# Patient Record
Sex: Male | Born: 1965 | Race: White | Hispanic: No | Smoking: Current every day smoker
Health system: Southern US, Community
[De-identification: ages and names within clinical notes are randomized; demographics above are authoritative.]

## PROBLEM LIST (undated history)

## (undated) DIAGNOSIS — F99 Mental disorder, not otherwise specified: Secondary | ICD-10-CM

## (undated) DIAGNOSIS — I2510 Atherosclerotic heart disease of native coronary artery without angina pectoris: Secondary | ICD-10-CM

## (undated) DIAGNOSIS — I209 Angina pectoris, unspecified: Secondary | ICD-10-CM

## (undated) DIAGNOSIS — I10 Essential (primary) hypertension: Secondary | ICD-10-CM

## (undated) DIAGNOSIS — I219 Acute myocardial infarction, unspecified: Secondary | ICD-10-CM

## (undated) DIAGNOSIS — I639 Cerebral infarction, unspecified: Secondary | ICD-10-CM

## (undated) DIAGNOSIS — R0602 Shortness of breath: Secondary | ICD-10-CM

## (undated) DIAGNOSIS — E119 Type 2 diabetes mellitus without complications: Secondary | ICD-10-CM

## (undated) DIAGNOSIS — M1990 Unspecified osteoarthritis, unspecified site: Secondary | ICD-10-CM

## (undated) DIAGNOSIS — E785 Hyperlipidemia, unspecified: Secondary | ICD-10-CM

## (undated) DIAGNOSIS — G709 Myoneural disorder, unspecified: Secondary | ICD-10-CM

## (undated) DIAGNOSIS — N289 Disorder of kidney and ureter, unspecified: Secondary | ICD-10-CM

## (undated) HISTORY — PX: BYPASS GRAFT: SHX909

## (undated) HISTORY — PX: CHOLECYSTECTOMY: SHX55

## (undated) HISTORY — PX: CORONARY ARTERY BYPASS GRAFT: SHX141

---

## 2013-10-17 ENCOUNTER — Encounter (HOSPITAL_COMMUNITY): Payer: Self-pay | Admitting: Emergency Medicine

## 2013-10-17 ENCOUNTER — Inpatient Hospital Stay (HOSPITAL_COMMUNITY): Payer: Medicaid Other

## 2013-10-17 ENCOUNTER — Emergency Department (HOSPITAL_COMMUNITY): Payer: Medicaid Other

## 2013-10-17 ENCOUNTER — Observation Stay (HOSPITAL_COMMUNITY)
Admission: EM | Admit: 2013-10-17 | Discharge: 2013-10-19 | Discharge status: Against Medical Advice | Payer: Medicaid Other | Attending: Family Medicine | Admitting: Family Medicine

## 2013-10-17 DIAGNOSIS — F17200 Nicotine dependence, unspecified, uncomplicated: Secondary | ICD-10-CM | POA: Insufficient documentation

## 2013-10-17 DIAGNOSIS — I672 Cerebral atherosclerosis: Secondary | ICD-10-CM | POA: Insufficient documentation

## 2013-10-17 DIAGNOSIS — Z7901 Long term (current) use of anticoagulants: Secondary | ICD-10-CM | POA: Insufficient documentation

## 2013-10-17 DIAGNOSIS — F329 Major depressive disorder, single episode, unspecified: Secondary | ICD-10-CM | POA: Insufficient documentation

## 2013-10-17 DIAGNOSIS — I129 Hypertensive chronic kidney disease with stage 1 through stage 4 chronic kidney disease, or unspecified chronic kidney disease: Secondary | ICD-10-CM | POA: Insufficient documentation

## 2013-10-17 DIAGNOSIS — Z7982 Long term (current) use of aspirin: Secondary | ICD-10-CM | POA: Insufficient documentation

## 2013-10-17 DIAGNOSIS — R739 Hyperglycemia, unspecified: Secondary | ICD-10-CM

## 2013-10-17 DIAGNOSIS — I10 Essential (primary) hypertension: Secondary | ICD-10-CM

## 2013-10-17 DIAGNOSIS — N183 Chronic kidney disease, stage 3 unspecified: Secondary | ICD-10-CM | POA: Insufficient documentation

## 2013-10-17 DIAGNOSIS — G459 Transient cerebral ischemic attack, unspecified: Secondary | ICD-10-CM

## 2013-10-17 DIAGNOSIS — D649 Anemia, unspecified: Secondary | ICD-10-CM | POA: Insufficient documentation

## 2013-10-17 DIAGNOSIS — N189 Chronic kidney disease, unspecified: Secondary | ICD-10-CM

## 2013-10-17 DIAGNOSIS — E119 Type 2 diabetes mellitus without complications: Secondary | ICD-10-CM | POA: Insufficient documentation

## 2013-10-17 DIAGNOSIS — Z8673 Personal history of transient ischemic attack (TIA), and cerebral infarction without residual deficits: Secondary | ICD-10-CM | POA: Insufficient documentation

## 2013-10-17 DIAGNOSIS — N289 Disorder of kidney and ureter, unspecified: Secondary | ICD-10-CM

## 2013-10-17 DIAGNOSIS — R079 Chest pain, unspecified: Principal | ICD-10-CM | POA: Insufficient documentation

## 2013-10-17 DIAGNOSIS — E785 Hyperlipidemia, unspecified: Secondary | ICD-10-CM | POA: Insufficient documentation

## 2013-10-17 DIAGNOSIS — I2510 Atherosclerotic heart disease of native coronary artery without angina pectoris: Secondary | ICD-10-CM | POA: Insufficient documentation

## 2013-10-17 DIAGNOSIS — F3289 Other specified depressive episodes: Secondary | ICD-10-CM | POA: Insufficient documentation

## 2013-10-17 DIAGNOSIS — F0150 Vascular dementia without behavioral disturbance: Secondary | ICD-10-CM | POA: Insufficient documentation

## 2013-10-17 DIAGNOSIS — I369 Nonrheumatic tricuspid valve disorder, unspecified: Secondary | ICD-10-CM

## 2013-10-17 DIAGNOSIS — Z951 Presence of aortocoronary bypass graft: Secondary | ICD-10-CM | POA: Insufficient documentation

## 2013-10-17 DIAGNOSIS — Z7902 Long term (current) use of antithrombotics/antiplatelets: Secondary | ICD-10-CM | POA: Insufficient documentation

## 2013-10-17 HISTORY — DX: Type 2 diabetes mellitus without complications: E11.9

## 2013-10-17 HISTORY — DX: Angina pectoris, unspecified: I20.9

## 2013-10-17 HISTORY — DX: Cerebral infarction, unspecified: I63.9

## 2013-10-17 HISTORY — DX: Hyperlipidemia, unspecified: E78.5

## 2013-10-17 HISTORY — DX: Shortness of breath: R06.02

## 2013-10-17 HISTORY — DX: Disorder of kidney and ureter, unspecified: N28.9

## 2013-10-17 HISTORY — DX: Essential (primary) hypertension: I10

## 2013-10-17 HISTORY — DX: Atherosclerotic heart disease of native coronary artery without angina pectoris: I25.10

## 2013-10-17 HISTORY — DX: Myoneural disorder, unspecified: G70.9

## 2013-10-17 HISTORY — DX: Acute myocardial infarction, unspecified: I21.9

## 2013-10-17 HISTORY — DX: Mental disorder, not otherwise specified: F99

## 2013-10-17 HISTORY — DX: Unspecified osteoarthritis, unspecified site: M19.90

## 2013-10-17 LAB — CBC
HCT: 33.1 % — ABNORMAL LOW (ref 39.0–52.0)
HEMOGLOBIN: 11.2 g/dL — AB (ref 13.0–17.0)
MCH: 29.5 pg (ref 26.0–34.0)
MCHC: 33.8 g/dL (ref 30.0–36.0)
MCV: 87.1 fL (ref 78.0–100.0)
Platelets: 336 10*3/uL (ref 150–400)
RBC: 3.8 MIL/uL — AB (ref 4.22–5.81)
RDW: 13.7 % (ref 11.5–15.5)
WBC: 9.6 10*3/uL (ref 4.0–10.5)

## 2013-10-17 LAB — D-DIMER, QUANTITATIVE (NOT AT ARMC): D DIMER QUANT: 0.51 ug{FEU}/mL — AB (ref 0.00–0.48)

## 2013-10-17 LAB — GLUCOSE, RANDOM: GLUCOSE: 646 mg/dL — AB (ref 70–99)

## 2013-10-17 LAB — GLUCOSE, CAPILLARY
Glucose-Capillary: 564 mg/dL (ref 70–99)
Glucose-Capillary: 135 mg/dL — ABNORMAL HIGH (ref 70–99)
Glucose-Capillary: 42 mg/dL — CL (ref 70–99)
Glucose-Capillary: 42 mg/dL — CL (ref 70–99)

## 2013-10-17 LAB — BASIC METABOLIC PANEL
BUN: 36 mg/dL — AB (ref 6–23)
CO2: 23 meq/L (ref 19–32)
Calcium: 9 mg/dL (ref 8.4–10.5)
Chloride: 97 mEq/L (ref 96–112)
Creatinine, Ser: 2.18 mg/dL — ABNORMAL HIGH (ref 0.50–1.35)
GFR calc Af Amer: 40 mL/min — ABNORMAL LOW (ref >90)
GFR, EST NON AFRICAN AMERICAN: 35 mL/min — AB (ref >90)
Glucose, Bld: 253 mg/dL — ABNORMAL HIGH (ref 70–99)
Potassium: 4.8 mEq/L (ref 3.7–5.3)
Sodium: 136 mEq/L — ABNORMAL LOW (ref 137–147)

## 2013-10-17 LAB — HEMOGLOBIN A1C
Hgb A1c MFr Bld: 9.3 % — ABNORMAL HIGH (ref <5.7)
MEAN PLASMA GLUCOSE: 220 mg/dL — AB (ref <117)

## 2013-10-17 LAB — TROPONIN I: Troponin I: <0.3 ng/mL (ref <0.30)

## 2013-10-17 LAB — POCT I-STAT TROPONIN I: Troponin i, poc: 0.01 ng/mL (ref 0.00–0.08)

## 2013-10-17 MED ORDER — GLUCOSE 40 % PO GEL
ORAL | Status: AC
Start: 2013-10-17 — End: 2013-10-17
  Administered 2013-10-17: 37.5 g
  Filled 2013-10-17: qty 1

## 2013-10-17 MED ORDER — FLUOXETINE HCL 20 MG PO CAPS
20.0000 mg | ORAL_CAPSULE | Freq: Every day | ORAL | Status: DC
  Administered 2013-10-17 – 2013-10-19 (×3): 20 mg via ORAL
  Filled 2013-10-17 (×3): qty 1

## 2013-10-17 MED ORDER — TOPIRAMATE 25 MG PO TABS
50.0000 mg | ORAL_TABLET | Freq: Two times a day (BID) | ORAL | Status: DC
  Administered 2013-10-17 – 2013-10-19 (×5): 50 mg via ORAL
  Filled 2013-10-17 (×7): qty 2

## 2013-10-17 MED ORDER — TECHNETIUM TO 99M ALBUMIN AGGREGATED
6.0000 | Freq: Once | INTRAVENOUS | Status: AC | PRN
  Administered 2013-10-17: 6 via INTRAVENOUS

## 2013-10-17 MED ORDER — INSULIN DETEMIR 100 UNIT/ML ~~LOC~~ SOLN
30.0000 [IU] | Freq: Every day | SUBCUTANEOUS | Status: DC
  Filled 2013-10-17 (×3): qty 0.3

## 2013-10-17 MED ORDER — SODIUM CHLORIDE 0.9 % IJ SOLN
3.0000 mL | Freq: Two times a day (BID) | INTRAMUSCULAR | Status: DC
  Administered 2013-10-17 – 2013-10-19 (×5): 3 mL via INTRAVENOUS

## 2013-10-17 MED ORDER — ASPIRIN 81 MG PO CHEW
324.0000 mg | CHEWABLE_TABLET | Freq: Once | ORAL | Status: AC
  Administered 2013-10-17: 324 mg via ORAL
  Filled 2013-10-17: qty 4

## 2013-10-17 MED ORDER — INSULIN ASPART 100 UNIT/ML ~~LOC~~ SOLN: 0.0000 [IU] | Freq: Three times a day (TID) | SUBCUTANEOUS | Status: DC

## 2013-10-17 MED ORDER — GABAPENTIN 600 MG PO TABS
300.0000 mg | ORAL_TABLET | Freq: Three times a day (TID) | ORAL | Status: DC
  Filled 2013-10-17 (×3): qty 0.5

## 2013-10-17 MED ORDER — SODIUM CHLORIDE 0.9 % IV BOLUS (SEPSIS)
1000.0000 mL | Freq: Once | INTRAVENOUS | Status: AC
  Administered 2013-10-17: 1000 mL via INTRAVENOUS

## 2013-10-17 MED ORDER — CLOPIDOGREL BISULFATE 75 MG PO TABS
75.0000 mg | ORAL_TABLET | Freq: Every day | ORAL | Status: DC
  Administered 2013-10-18 – 2013-10-19 (×2): 75 mg via ORAL
  Filled 2013-10-17 (×3): qty 1

## 2013-10-17 MED ORDER — ASPIRIN EC 325 MG PO TBEC
325.0000 mg | DELAYED_RELEASE_TABLET | Freq: Every day | ORAL | Status: DC
  Administered 2013-10-17 – 2013-10-19 (×3): 325 mg via ORAL
  Filled 2013-10-17 (×3): qty 1

## 2013-10-17 MED ORDER — CARVEDILOL 6.25 MG PO TABS
6.2500 mg | ORAL_TABLET | Freq: Two times a day (BID) | ORAL | Status: DC
  Administered 2013-10-17 – 2013-10-19 (×4): 6.25 mg via ORAL
  Filled 2013-10-17 (×6): qty 1

## 2013-10-17 MED ORDER — TECHNETIUM TC 99M DIETHYLENETRIAME-PENTAACETIC ACID: 40.0000 | Freq: Once | INTRAVENOUS | Status: AC | PRN

## 2013-10-17 MED ORDER — ENOXAPARIN SODIUM 80 MG/0.8ML ~~LOC~~ SOLN
1.0000 mg/kg | Freq: Two times a day (BID) | SUBCUTANEOUS | Status: DC
  Administered 2013-10-17: 13:00:00 via SUBCUTANEOUS
  Administered 2013-10-18: 75 mg via SUBCUTANEOUS
  Filled 2013-10-17 (×5): qty 0.8

## 2013-10-17 MED ORDER — MORPHINE SULFATE 2 MG/ML IJ SOLN
2.0000 mg | INTRAMUSCULAR | Status: DC | PRN
  Administered 2013-10-17: 2 mg via INTRAVENOUS
  Filled 2013-10-17: qty 1

## 2013-10-17 MED ORDER — NITROGLYCERIN 0.4 MG SL SUBL
0.4000 mg | SUBLINGUAL_TABLET | SUBLINGUAL | Status: AC | PRN
  Administered 2013-10-17 (×3): 0.4 mg via SUBLINGUAL

## 2013-10-17 MED ORDER — MORPHINE SULFATE 4 MG/ML IJ SOLN
4.0000 mg | Freq: Once | INTRAMUSCULAR | Status: AC
  Administered 2013-10-17: 4 mg via INTRAVENOUS
  Filled 2013-10-17: qty 1

## 2013-10-17 MED ORDER — ATORVASTATIN CALCIUM 40 MG PO TABS
40.0000 mg | ORAL_TABLET | Freq: Every day | ORAL | Status: DC
  Administered 2013-10-17 – 2013-10-18 (×2): 40 mg via ORAL
  Filled 2013-10-17 (×3): qty 1

## 2013-10-17 MED ORDER — INSULIN DETEMIR 100 UNIT/ML ~~LOC~~ SOLN
30.0000 [IU] | Freq: Every day | SUBCUTANEOUS | Status: DC
  Filled 2013-10-17: qty 0.3

## 2013-10-17 MED ORDER — INSULIN ASPART 100 UNIT/ML ~~LOC~~ SOLN
0.0000 [IU] | Freq: Three times a day (TID) | SUBCUTANEOUS | Status: DC
Start: 2013-10-17 — End: 2013-10-19
  Administered 2013-10-17: 3 [IU] via SUBCUTANEOUS
  Administered 2013-10-17 – 2013-10-18 (×2): 20 [IU] via SUBCUTANEOUS
  Administered 2013-10-18: 15 [IU] via SUBCUTANEOUS

## 2013-10-17 MED ORDER — INSULIN DETEMIR 100 UNIT/ML ~~LOC~~ SOLN
30.0000 [IU] | Freq: Every day | SUBCUTANEOUS | Status: DC
  Filled 2013-10-17 (×2): qty 0.3

## 2013-10-17 MED ORDER — LAMOTRIGINE 25 MG PO TABS
25.0000 mg | ORAL_TABLET | Freq: Two times a day (BID) | ORAL | Status: DC
  Administered 2013-10-17 – 2013-10-19 (×5): 25 mg via ORAL
  Filled 2013-10-17 (×6): qty 1

## 2013-10-17 MED ORDER — GABAPENTIN 300 MG PO CAPS
300.0000 mg | ORAL_CAPSULE | Freq: Three times a day (TID) | ORAL | Status: DC
  Administered 2013-10-17 – 2013-10-19 (×6): 300 mg via ORAL
  Filled 2013-10-17 (×9): qty 1

## 2013-10-17 MED ORDER — SODIUM CHLORIDE 0.9 % IJ SOLN: 3.0000 mL | INTRAMUSCULAR | Status: DC | PRN

## 2013-10-17 MED ORDER — ALPRAZOLAM 0.25 MG PO TABS
0.5000 mg | ORAL_TABLET | Freq: Two times a day (BID) | ORAL | Status: DC
  Administered 2013-10-17 – 2013-10-19 (×4): 0.5 mg via ORAL
  Filled 2013-10-17 (×5): qty 2

## 2013-10-17 MED ORDER — INSULIN DETEMIR 100 UNIT/ML ~~LOC~~ SOLN
30.0000 [IU] | Freq: Once | SUBCUTANEOUS | Status: AC
  Administered 2013-10-17: 30 [IU] via SUBCUTANEOUS
  Filled 2013-10-17: qty 0.3

## 2013-10-17 MED ORDER — ONDANSETRON HCL 4 MG/2ML IJ SOLN
4.0000 mg | Freq: Once | INTRAMUSCULAR | Status: AC
Start: 2013-10-17 — End: 2013-10-17
  Administered 2013-10-17: 4 mg via INTRAVENOUS
  Filled 2013-10-17: qty 2

## 2013-10-17 MED ORDER — MEMANTINE HCL 10 MG PO TABS
10.0000 mg | ORAL_TABLET | Freq: Two times a day (BID) | ORAL | Status: DC
  Administered 2013-10-17 – 2013-10-19 (×5): 10 mg via ORAL
  Filled 2013-10-17 (×6): qty 1

## 2013-10-17 MED ORDER — INSULIN ASPART 100 UNIT/ML ~~LOC~~ SOLN
10.0000 [IU] | Freq: Three times a day (TID) | SUBCUTANEOUS | Status: DC
Start: 2013-10-17 — End: 2013-10-19
  Administered 2013-10-17 – 2013-10-18 (×3): 10 [IU] via SUBCUTANEOUS

## 2013-10-17 MED ORDER — MORPHINE SULFATE 4 MG/ML IJ SOLN
4.0000 mg | Freq: Once | INTRAMUSCULAR | Status: AC
Start: 2013-10-17 — End: 2013-10-17
  Administered 2013-10-17: 4 mg via INTRAVENOUS
  Filled 2013-10-17: qty 1

## 2013-10-17 MED ORDER — EZETIMIBE 10 MG PO TABS
10.0000 mg | ORAL_TABLET | Freq: Every day | ORAL | Status: DC
  Administered 2013-10-17 – 2013-10-19 (×3): 10 mg via ORAL
  Filled 2013-10-17 (×3): qty 1

## 2013-10-17 MED ORDER — NITROGLYCERIN 2 % TD OINT
0.5000 [in_us] | TOPICAL_OINTMENT | Freq: Four times a day (QID) | TRANSDERMAL | Status: DC
  Administered 2013-10-17 – 2013-10-19 (×6): 0.5 [in_us] via TOPICAL
  Filled 2013-10-17: qty 30

## 2013-10-17 MED ORDER — SODIUM CHLORIDE 0.9 % IV SOLN: 250.0000 mL | INTRAVENOUS | Status: DC | PRN

## 2013-10-17 MED ORDER — NITROGLYCERIN 2 % TD OINT
0.5000 [in_us] | TOPICAL_OINTMENT | Freq: Four times a day (QID) | TRANSDERMAL | Status: DC
  Administered 2013-10-17: 0.5 [in_us] via TOPICAL
  Filled 2013-10-17: qty 1

## 2013-10-17 NOTE — H&P (Signed)
PCP:   No PCP Per Patient   Chief Complaint:  Chest pain  HPI: 48 year old male who   has a past medical history of Diabetes mellitus without complication; Hypertension; and Hyperlipidemia, multiple TIAs, CAD status post CABG, x4 vessel bypass in 2005, vascular dementia, who came to the hospital last night after patient experienced chest pain while waiting at the bus station. Patient has been traveling from [place] to [place] to meet his new girlfriend, patient says that chest pain was associated with shortness of breath and nausea, pain was persistent and was relieved when he came to the ED report morphine. Patient was recently hospitalized in [place] for influenza. Patient has not seen the cardiologist in past 2 years. He does smoke cigarettes. Patient says that he has been taking his medications as prescribed. At this time patient does have right-sided chest pain under his right ribs, which becomes worse on deep breathing. In the ED patient was found to have elevated d-dimer of 0.51, troponin is negative. EKG shows nonspecific T wave abnormalities.   Allergies:   Allergies  Allergen Reactions  . Dilantin [Phenytoin Sodium Extended] Itching  . Levaquin [Levofloxacin In D5w] Itching  . Seroquel [Quetiapine Fumarate] Itching      Past Medical History  Diagnosis Date  . Diabetes mellitus without complication   . Hypertension   . Hyperlipidemia     Past Surgical History  Procedure Laterality Date  . Bypass graft      quadruple    Prior to Admission medications   Not on File    Social History:  reports that he has been smoking.  He has never used smokeless tobacco. He reports that he does not drink alcohol or use illicit drugs.  Family history- patient's mother and father both had coronary artery disease   All the positives are listed in BOLD  Review of Systems:  HEENT: Headache, blurred vision, runny nose, sore throat Neck: Hypothyroidism,  hyperthyroidism,,lymphadenopathy Chest : Shortness of breath, history of COPD, Asthma Heart : Chest pain, history of coronary arterey disease GI:  Nausea, vomiting, diarrhea, constipation, GERD GU: Dysuria, urgency, frequency of urination, hematuria Neuro: Stroke, seizures, syncope Psych: Depression, anxiety, hallucinations   Physical Exam: Blood pressure 121/68, pulse 81, temperature 98 F (36.7 C), temperature source Oral, resp. rate 12, height $RemoveBe'5\' 8"'HQOPFmotg$  (1.727 m), weight 76.658 kg (169 lb), SpO2 96.00%. Constitutional:   Patient is a well-developed and well-nourished male* in no acute distress and cooperative with exam. Head: Normocephalic and atraumatic Mouth: Mucus membranes moist Eyes: PERRL, EOMI, conjunctivae normal Neck: Supple, No Thyromegaly Cardiovascular: RRR, S1 normal, S2 normal Pulmonary/Chest: CTAB, no wheezes, rales, or rhonchi Chest wall- positive tenderness to palpation under the right ribs Abdominal: Soft. Non-tender, non-distended, bowel sounds are normal, no masses, organomegaly, or guarding present.  Neurological: A&O x3, Strenght is normal and symmetric bilaterally, cranial nerve II-XII are grossly intact, no focal motor deficit, sensory intact to light touch bilaterally.  Extremities : No Cyanosis, Clubbing or Edema   Labs on Admission:  Results for orders placed during the hospital encounter of 10/17/13 (from the past 48 hour(s))  POCT I-STAT TROPONIN I     Status: None   Collection Time    10/17/13  1:52 AM      Result Value Range   Troponin i, poc 0.01  0.00 - 0.08 ng/mL   Comment 3            Comment: Due to the release kinetics of cTnI,  a negative result within the first hours     of the onset of symptoms does not rule out     myocardial infarction with certainty.     If myocardial infarction is still suspected,     repeat the test at appropriate intervals.  CBC     Status: Abnormal   Collection Time    10/17/13  1:56 AM      Result Value Range    WBC 9.6  4.0 - 10.5 K/uL   RBC 3.80 (*) 4.22 - 5.81 MIL/uL   Hemoglobin 11.2 (*) 13.0 - 17.0 g/dL   HCT 33.1 (*) 39.0 - 52.0 %   MCV 87.1  78.0 - 100.0 fL   MCH 29.5  26.0 - 34.0 pg   MCHC 33.8  30.0 - 36.0 g/dL   RDW 13.7  11.5 - 15.5 %   Platelets 336  150 - 400 K/uL  BASIC METABOLIC PANEL     Status: Abnormal   Collection Time    10/17/13  1:56 AM      Result Value Range   Sodium 136 (*) 137 - 147 mEq/L   Potassium 4.8  3.7 - 5.3 mEq/L   Chloride 97  96 - 112 mEq/L   CO2 23  19 - 32 mEq/L   Glucose, Bld 253 (*) 70 - 99 mg/dL   BUN 36 (*) 6 - 23 mg/dL   Creatinine, Ser 2.18 (*) 0.50 - 1.35 mg/dL   Calcium 9.0  8.4 - 10.5 mg/dL   GFR calc non Af Amer 35 (*) >90 mL/min   GFR calc Af Amer 40 (*) >90 mL/min   Comment: (NOTE)     The eGFR has been calculated using the CKD EPI equation.     This calculation has not been validated in all clinical situations.     eGFR's persistently <90 mL/min signify possible Chronic Kidney     Disease.  D-DIMER, QUANTITATIVE     Status: Abnormal   Collection Time    10/17/13  3:00 AM      Result Value Range   D-Dimer, Quant 0.51 (*) 0.00 - 0.48 ug/mL-FEU   Comment:            AT THE INHOUSE ESTABLISHED CUTOFF     VALUE OF 0.48 ug/mL FEU,     THIS ASSAY HAS BEEN DOCUMENTED     IN THE LITERATURE TO HAVE     A SENSITIVITY AND NEGATIVE     PREDICTIVE VALUE OF AT LEAST     98 TO 99%.  THE TEST RESULT     SHOULD BE CORRELATED WITH     AN ASSESSMENT OF THE CLINICAL     PROBABILITY OF DVT / VTE.    Radiological Exams on Admission: Dg Chest Port 1 View  10/17/2013   CLINICAL DATA:  Chest pain  EXAM: PORTABLE CHEST - 1 VIEW  COMPARISON:  None available  FINDINGS: Median sternotomy wires with underlying CABG markers and surgical clips are noted. Transverse heart size is within normal limits. Mediastinal silhouette is unremarkable.  Lungs are normally inflated. No focal infiltrate, pulmonary edema, or pleural effusion identified. No pneumothorax.   No acute osseous abnormality identified.  IMPRESSION: No acute cardiopulmonary abnormality.   Electronically Signed   By: Jeannine Boga M.D.   On: 10/17/2013 01:51    Assessment/Plan Principal Problem:   Chest pain Active Problems:   CAD (coronary artery disease)   HTN (hypertension)   Hyperlipidemia   Vascular dementia  TIA (transient ischemic attack)   DM (diabetes mellitus)  chronic kidney disease  Chest pain  We'll admit the patient under telemetry, patient has significant history of CAD status post CABG. Will consult cardiology for possible stress test.  Will continue Coreg, aspirin and Plavix. We'll start the patient on nitro paste half inch every 6 hours  Elevated d-dimer Patient has elevated d-dimer, will obtain nuclear perfusion scan as patient has C. KD and will not be able to get IV contrast. Patient will be empirically started on full dose Lovenox which can be discontinued a nuclear perfusion scan is negative for pulmonary embolism  Chronic kidney disease stage III Stable will continue to monitor the patient's creatinine in the hospital  Diabetes mellitus Will start Levemir 30 units at bedtime along with sliding scale insulin We'll also obtain hemoglobin A1c  Hyperlipidemia Continue Zetia, Lipitor  Vascular dementia Continue Namenda  Depression Continue Prozac  Hypertension Continue Coreg  Code status:Patient is presumed full code  Family discussion:No family at bedside   Time Spent on Admission: 75 min  [facility] Pager: [phone] 10/17/2013, 8:03 AM  If 7PM-7AM, please contact night-coverage  [email or web address]  Password [email or web address]

## 2013-10-17 NOTE — Consult Note (Signed)
HPI: 48 year old male with past medical history of coronary artery disease for evaluation of chest pain. Patient lives in [place]. He is status post coronary artery bypass and graft in 2005. I do not have those records available. He apparently had a myocardial infarction prior to his surgery. He has had intermittent chest pain since then. He states he has not had a repeat catheterization. He traveled to [place] to meet his girlfriend. While at the bus station he developed chest pain. The pain is in the left chest area without radiation. It increases with inspiration. There is some shortness of breath and occasional nausea but no diaphoresis. His pain can last all day at times. His symptoms occur both with exertion and at rest. The pain is described as stabbing. He was admitted and cardiology is now asked to evaluate.   (Not in a hospital admission)  Allergies  Allergen Reactions  . Dilantin [Phenytoin Sodium Extended] Itching  . Levaquin [Levofloxacin In D5w] Itching  . Seroquel [Quetiapine Fumarate] Itching    Past Medical History  Diagnosis Date  . Diabetes mellitus without complication   . Hypertension   . Hyperlipidemia   . CAD (coronary artery disease)   . Renal insufficiency     Past Surgical History  Procedure Laterality Date  . Bypass graft      quadruple  . Cholecystectomy      History   Social History  . Marital Status: Divorced    Spouse Name: N/A    Number of Children: N/A  . Years of Education: N/A   Occupational History  . Not on file.   Social History Main Topics  . Smoking status: Current Every Day Smoker -- 0.50 packs/day  . Smokeless tobacco: Never Used  . Alcohol Use: No  . Drug Use: No  . Sexual Activity: Not on file   Other Topics Concern  . Not on file   Social History Narrative  . No narrative on file    Family History  Problem Relation Age of Onset  . CAD Father   . CAD Mother     ROS:  no fevers or  chills, productive cough, hemoptysis, dysphasia, odynophagia, melena, hematochezia, dysuria, hematuria, rash, seizure activity, orthopnea, PND, pedal edema, claudication. Remaining systems are negative.  Physical Exam:   Blood pressure 121/68, pulse 81, temperature 98 F (36.7 C), temperature source Oral, resp. rate 12, height $RemoveBe'5\' 8"'NMzdpDqVD$  (1.727 m), weight 169 lb (76.658 kg), SpO2 96.00%.  General:  Well developed/well nourished in NAD Skin warm/dry, tattoos Patient not depressed No peripheral clubbing Back-normal HEENT-normal/normal eyelids Neck supple/normal carotid upstroke bilaterally; no bruits; no JVD; no thyromegaly chest - CTA/ normal expansion CV - RRR/normal S1 and S2; no murmurs, rubs or gallops;  PMI nondisplaced Abdomen -NT/ND, no HSM, no mass, + bowel sounds, no bruit 2+ femoral pulses, no bruits Ext-no edema or chords, 2+ DP on the right and 1+ on the left Neuro-grossly nonfocal  ECG sinus rhythm with nonspecific ST changes.  Results for orders placed during the hospital encounter of 10/17/13 (from the past 48 hour(s))  POCT I-STAT TROPONIN I     Status: None   Collection Time    10/17/13  1:52 AM      Result Value Range   Troponin i, poc 0.01  0.00 - 0.08 ng/mL   Comment 3            Comment: Due to the release kinetics of cTnI,  a negative result within the first hours     of the onset of symptoms does not rule out     myocardial infarction with certainty.     If myocardial infarction is still suspected,     repeat the test at appropriate intervals.  CBC     Status: Abnormal   Collection Time    10/17/13  1:56 AM      Result Value Range   WBC 9.6  4.0 - 10.5 K/uL   RBC 3.80 (*) 4.22 - 5.81 MIL/uL   Hemoglobin 11.2 (*) 13.0 - 17.0 g/dL   HCT 33.1 (*) 39.0 - 52.0 %   MCV 87.1  78.0 - 100.0 fL   MCH 29.5  26.0 - 34.0 pg   MCHC 33.8  30.0 - 36.0 g/dL   RDW 13.7  11.5 - 15.5 %   Platelets 336  150 - 400 K/uL  BASIC METABOLIC PANEL     Status: Abnormal    Collection Time    10/17/13  1:56 AM      Result Value Range   Sodium 136 (*) 137 - 147 mEq/L   Potassium 4.8  3.7 - 5.3 mEq/L   Chloride 97  96 - 112 mEq/L   CO2 23  19 - 32 mEq/L   Glucose, Bld 253 (*) 70 - 99 mg/dL   BUN 36 (*) 6 - 23 mg/dL   Creatinine, Ser 2.18 (*) 0.50 - 1.35 mg/dL   Calcium 9.0  8.4 - 10.5 mg/dL   GFR calc non Af Amer 35 (*) >90 mL/min   GFR calc Af Amer 40 (*) >90 mL/min   Comment: (NOTE)     The eGFR has been calculated using the CKD EPI equation.     This calculation has not been validated in all clinical situations.     eGFR's persistently <90 mL/min signify possible Chronic Kidney     Disease.  D-DIMER, QUANTITATIVE     Status: Abnormal   Collection Time    10/17/13  3:00 AM      Result Value Range   D-Dimer, Quant 0.51 (*) 0.00 - 0.48 ug/mL-FEU   Comment:            AT THE INHOUSE ESTABLISHED CUTOFF     VALUE OF 0.48 ug/mL FEU,     THIS ASSAY HAS BEEN DOCUMENTED     IN THE LITERATURE TO HAVE     A SENSITIVITY AND NEGATIVE     PREDICTIVE VALUE OF AT LEAST     98 TO 99%.  THE TEST RESULT     SHOULD BE CORRELATED WITH     AN ASSESSMENT OF THE CLINICAL     PROBABILITY OF DVT / VTE.    Dg Chest Port 1 View  10/17/2013   CLINICAL DATA:  Chest pain  EXAM: PORTABLE CHEST - 1 VIEW  COMPARISON:  None available  FINDINGS: Median sternotomy wires with underlying CABG markers and surgical clips are noted. Transverse heart size is within normal limits. Mediastinal silhouette is unremarkable.  Lungs are normally inflated. No focal infiltrate, pulmonary edema, or pleural effusion identified. No pneumothorax.  No acute osseous abnormality identified.  IMPRESSION: No acute cardiopulmonary abnormality.   Electronically Signed   By: Jeannine Boga M.D.   On: 10/17/2013 01:51    Assessment/Plan 1 chest pain-symptoms are atypical. They are increased with palpation. Possible musculoskeletal chest pain. Initial enzymes negative. Plan rule out myocardial infarction  with serial enzymes. If negative we'll plan  functional study tomorrow morning. I would avoid catheterization unless study is high risk given baseline renal insufficiency and risk of contrast nephropathy. He will need close followup with his cardiologist when he returns to [place]. Patient's d-dimer is elevated and the hospitalists are planning VQ scan. Continue aspirin, beta blocker and statin. 2 coronary artery disease-would treat with aspirin and statin. 3 stage III chronic renal insufficiency-the chronicity is unclear. This is most likely related to his diabetes mellitus. He will need followup with his primary care physician when he returns home. 4 diabetes mellitus-follow CBG, management per primary care. 5 hyperlipidemia-continue statin. 6 anemia-chronicity unclear. Most likely related to renal insufficiency. 7 tobacco abuse-patient counseled on discontinuing.  Kirk Ruths MD 10/17/2013, 8:40 AM

## 2013-10-17 NOTE — Progress Notes (Signed)
  Echocardiogram 2D Echocardiogram has been performed.  Camani Sesay FRANCES 10/17/2013, 4:25 PM

## 2013-10-17 NOTE — ED Notes (Signed)
Pt arrives via EMS c/o  Initially high blood sugar. CBG 320. Pt then statedthat at 9p he started experiencing chest pain, stabbing, sharp, non radiating, worse when breathing. 182/118 in truck. Initial pain 8/10 324 ASA , 2 NTG, After 2nd NTG 6/10. 108/79 HR 114 100% RA. Hx bypass 10 years ago, DM (novolog and levemir), HTN. Allergies: levaquin, dilantin, hyperlipidemia.

## 2013-10-17 NOTE — ED Notes (Signed)
Admitting MD requested pharmacy tech to review pt's medications. Notified pharmacy tech. Pt unable to remember his medications. Tech will call pharmacy to try to obtain list.

## 2013-10-17 NOTE — ED Notes (Signed)
Spoke with bed control concerning pt's bed request. Pt is assigned to 2W30. Bed control will call [facility]. Report has been called.

## 2013-10-17 NOTE — ED Notes (Addendum)
Pt does not need to be NPO for pulmonary test (checked with Nuc Med), will give pt [place] sandwich per admitting MD request

## 2013-10-17 NOTE — ED Notes (Signed)
Paged and spoke with IV team

## 2013-10-17 NOTE — ED Provider Notes (Signed)
CSN: [id number]     Arrival date & time 10/17/13  0112 History   First MD Initiated Contact with Patient 10/17/13 0251     No chief complaint on file.  (Consider location/radiation/quality/duration/timing/severity/associated sxs/prior Treatment) HPI Pt presenting with c/o chest pain. He states he [place] the bus station- has been traveling from [place] to [place] to visit a new girlfriend.  States he was seated when chest pain began.  States he has hx of CABG, MI and stroke.  Was recently hospitalized in [place] for influenza.  States pain was 10/10 and down to 6/10 after nitroglycerin.  Denies recent cardiac cath.  Bypass was perfomed 10 years ago in [place].  No leg swelling.  No fever/chills.  No cough.  States he gets chest pains at times when his blood sugar is high.  There are no other associated systemic symptoms, there are no other alleviating or modifying factors.   Past Medical History  Diagnosis Date  . Diabetes mellitus without complication   . Hypertension   . Hyperlipidemia    Past Surgical History  Procedure Laterality Date  . Bypass graft      quadruple   No family history on file. History  Substance Use Topics  . Smoking status: Current Every Day Smoker -- 0.50 packs/day  . Smokeless tobacco: Never Used  . Alcohol Use: No    Review of Systems ROS reviewed and all otherwise negative except for mentioned in HPI  Allergies  Dilantin; Levaquin; and Seroquel  Home Medications  No current outpatient prescriptions on file. BP 130/63  Pulse 98  Temp(Src) 98 F (36.7 C) (Oral)  Resp 18  Ht 5\' 8"  (1.727 m)  Wt 169 lb (76.658 kg)  BMI 25.70 kg/m2  SpO2 96% Vitals reviewed Physical Exam Physical Examination: General appearance - alert, well appearing, and in no distress Mental status - alert, oriented to person, place, and time Eyes - no conjunctival injection, no scleral icterus Mouth - mucous membranes moist, pharynx normal without lesions Chest -  clear to auscultation, no wheezes, rales or rhonchi, symmetric air entry, well healed central vertical scar Heart - normal rate, regular rhythm, normal S1, S2, no murmurs, rubs, clicks or gallops Abdomen - soft, nontender, nondistended, no masses or organomegaly Extremities - peripheral pulses normal, no pedal edema, no clubbing or cyanosis Skin - normal coloration and turgor, no rashes  ED Course  Procedures (including critical care time)  6:06 AM d/w Dr. Onalee Hua, triad, pt to be admitted to telemetry bed.  Labs Review Labs Reviewed  CBC - Abnormal; Notable for the following:    RBC 3.80 (*)    Hemoglobin 11.2 (*)    HCT 33.1 (*)    All other components within normal limits  BASIC METABOLIC PANEL - Abnormal; Notable for the following:    Sodium 136 (*)    Glucose, Bld 253 (*)    BUN 36 (*)    Creatinine, Ser 2.18 (*)    GFR calc non Af Amer 35 (*)    GFR calc Af Amer 40 (*)    All other components within normal limits  D-DIMER, QUANTITATIVE - Abnormal; Notable for the following:    D-Dimer, Quant 0.51 (*)    All other components within normal limits  POCT I-STAT TROPONIN I   Imaging Review Dg Chest Port 1 View  10/17/2013   CLINICAL DATA:  Chest pain  EXAM: PORTABLE CHEST - 1 VIEW  COMPARISON:  None available  FINDINGS: Median sternotomy wires with  underlying CABG markers and surgical clips are noted. Transverse heart size is within normal limits. Mediastinal silhouette is unremarkable.  Lungs are normally inflated. No focal infiltrate, pulmonary edema, or pleural effusion identified. No pneumothorax.  No acute osseous abnormality identified.  IMPRESSION: No acute cardiopulmonary abnormality.   Electronically Signed   By: Rise MuBenjamin  McClintock M.D.   On: 10/17/2013 01:51    EKG Interpretation    Date/Time:  Tuesday October 17 2013 01:24:34 EST Ventricular Rate:  104 PR Interval:  145 QRS Duration: 108 QT Interval:  362 QTC Calculation: 476 R Axis:   85 Text Interpretation:   Sinus tachycardia Borderline T wave abnormalities Borderline prolonged QT interval basline artifact No old tracing to compare Confirmed by [employer] [facility]  MD, Jayel Inks [phone]([id number]) on 10/17/2013 3:11:36 AM            MDM   1. Chest pain   2. Renal insufficiency   3. Hyperglycemia    Pt presenting with c/o chest pain. He has hx of CAD, CABG, and stroke approx 10 years ago per his report.  Chest pain free after nitroglycerin and morphine.  Mildly hyperglycemic as well.  Pt is mildly tachycardic, d-dimer- mildly elevated so will order VQ scan due to his renal insufficiency.  D/w triad who will admit patient to tele bed.     Ethelda ChickMartha K Linker, MD 10/17/13 [phone]

## 2013-10-18 DIAGNOSIS — R079 Chest pain, unspecified: Principal | ICD-10-CM

## 2013-10-18 LAB — COMPREHENSIVE METABOLIC PANEL
ALT: 11 U/L (ref 0–53)
AST: 18 U/L (ref 0–37)
Albumin: 2.3 g/dL — ABNORMAL LOW (ref 3.5–5.2)
Alkaline Phosphatase: 98 U/L (ref 39–117)
BUN: 32 mg/dL — ABNORMAL HIGH (ref 6–23)
CO2: 22 mEq/L (ref 19–32)
Calcium: 7.9 mg/dL — ABNORMAL LOW (ref 8.4–10.5)
Chloride: 96 mEq/L (ref 96–112)
Creatinine, Ser: 1.85 mg/dL — ABNORMAL HIGH (ref 0.50–1.35)
GFR calc Af Amer: 49 mL/min — ABNORMAL LOW (ref >90)
GFR calc non Af Amer: 42 mL/min — ABNORMAL LOW (ref >90)
Glucose, Bld: 220 mg/dL — ABNORMAL HIGH (ref 70–99)
Potassium: 4.7 mEq/L (ref 3.7–5.3)
Sodium: 132 mEq/L — ABNORMAL LOW (ref 137–147)
Total Bilirubin: <0.2 mg/dL — ABNORMAL LOW (ref 0.3–1.2)
Total Protein: 5.5 g/dL — ABNORMAL LOW (ref 6.0–8.3)

## 2013-10-18 LAB — CBC
HCT: 29.1 % — ABNORMAL LOW (ref 39.0–52.0)
HEMOGLOBIN: 9.8 g/dL — AB (ref 13.0–17.0)
MCH: 29.5 pg (ref 26.0–34.0)
MCHC: 33.7 g/dL (ref 30.0–36.0)
MCV: 87.7 fL (ref 78.0–100.0)
Platelets: 260 10*3/uL (ref 150–400)
RBC: 3.32 MIL/uL — ABNORMAL LOW (ref 4.22–5.81)
RDW: 13.7 % (ref 11.5–15.5)
WBC: 6.7 10*3/uL (ref 4.0–10.5)

## 2013-10-18 LAB — GLUCOSE, CAPILLARY
GLUCOSE-CAPILLARY: 104 mg/dL — AB (ref 70–99)
GLUCOSE-CAPILLARY: 34 mg/dL — AB (ref 70–99)
GLUCOSE-CAPILLARY: 284 mg/dL — AB (ref 70–99)
Glucose-Capillary: 106 mg/dL — ABNORMAL HIGH (ref 70–99)
Glucose-Capillary: 226 mg/dL — ABNORMAL HIGH (ref 70–99)
Glucose-Capillary: 308 mg/dL — ABNORMAL HIGH (ref 70–99)
Glucose-Capillary: 316 mg/dL — ABNORMAL HIGH (ref 70–99)
Glucose-Capillary: 354 mg/dL — ABNORMAL HIGH (ref 70–99)
Glucose-Capillary: 69 mg/dL — ABNORMAL LOW (ref 70–99)
Glucose-Capillary: 99 mg/dL (ref 70–99)
Glucose-Capillary: <10 mg/dL — CL (ref 70–99)

## 2013-10-18 LAB — TROPONIN I: Troponin I: <0.3 ng/mL (ref <0.30)

## 2013-10-18 MED ORDER — DEXTROSE 50 % IV SOLN
25.0000 mL | Freq: Once | INTRAVENOUS | Status: AC | PRN
  Administered 2013-10-18: 25 mL via INTRAVENOUS

## 2013-10-18 MED ORDER — DEXTROSE 50 % IV SOLN
25.0000 mL | Freq: Once | INTRAVENOUS | Status: AC
  Administered 2013-10-18: 25 mL via INTRAVENOUS

## 2013-10-18 MED ORDER — DEXTROSE 50 % IV SOLN
INTRAVENOUS | Status: AC
  Filled 2013-10-18: qty 50

## 2013-10-18 MED ORDER — DEXTROSE 50 % IV SOLN
INTRAVENOUS | Status: AC
Start: 2013-10-18 — End: 2013-10-18
  Filled 2013-10-18: qty 50

## 2013-10-18 MED ORDER — DEXTROSE-NACL 5-0.9 % IV SOLN
INTRAVENOUS | Status: DC
  Administered 2013-10-18: 13:00:00 via INTRAVENOUS

## 2013-10-18 MED ORDER — ENOXAPARIN SODIUM 40 MG/0.4ML ~~LOC~~ SOLN
40.0000 mg | SUBCUTANEOUS | Status: DC
  Administered 2013-10-18: 40 mg via SUBCUTANEOUS
  Filled 2013-10-18 (×2): qty 0.4

## 2013-10-18 NOTE — Progress Notes (Signed)
ANTICOAGULATION CONSULT NOTE - Initial Consult  Pharmacy Consult for lovenox Indication: VTE prophylaxis  Allergies  Allergen Reactions  . Dilantin [Phenytoin Sodium Extended] Itching  . Levaquin [Levofloxacin In D5w] Itching  . Seroquel [Quetiapine Fumarate] Itching    Patient Measurements: Height: 5\' 8"  (172.7 cm) Weight: 166 lb 7.2 oz (75.5 kg) IBW/kg (Calculated) : 68.4   Vital Signs: BP: 161/72 mmHg (02/04 1020) Pulse Rate: 78 (02/04 1020)  Labs:  Recent Labs  10/17/13 0156 10/17/13 1300 10/17/13 1920 10/18/13 0053  HGB 11.2*  --   --  9.8*  HCT 33.1*  --   --  29.1*  PLT 336  --   --  260  CREATININE 2.18*  --   --  1.85*  TROPONINI  --  <0.30 <0.30 <0.30    Estimated Creatinine Clearance: 48.8 ml/min (by C-G formula based on Cr of 1.85).   Medical History: Past Medical History  Diagnosis Date  . Diabetes mellitus without complication   . Hypertension   . Hyperlipidemia   . CAD (coronary artery disease)   . Renal insufficiency   . Myocardial infarction   . Anginal pain   . Mental disorder     BIPOLAR  . Shortness of breath   . Stroke   . Neuromuscular disorder     DIABETIC NEUROPATHY  . Arthritis     Assessment: Patient is a 48 y.o M admitted with CP and was started on full dose lovenox. Cardiac enzymes are negative.  Patient does have renal insufficiency but scr appears to be trending down with 1.85 today (crcl~49).  To change lovenox to VTE prophylaxis dose per Dr. Cena BentonVega.   Plan:  1) start lovenox 40mg  SQ q24h 2) pharmacy will sign off for now but will adjust dose if crcl<30     Myrla Malanowski P 10/18/2013,12:32 PM

## 2013-10-18 NOTE — Clinical Documentation Improvement (Signed)
Possible Clinical Conditions?   __Diabetes Type   or 2 __Controlled or uncontrolled  Manifestations:  __DM retinopathy  __DM PVD __DM neuropathy   __DM nephropathy  __Other Condition __Cannot Clinically determine    Risk Factors: Patient with a history of diabetes mellitus per 10/17/13 H&P.  Labs: 02/03:   HgbA1c: 9.3 Mean plasma glucose: 220 Glucose, blood: 646  Thank You, Marciano SequinWanda Mathews-Bethea,RN,BSN, Clinical Documentation Specialist:  [phone]  [facility]- Health Information Management

## 2013-10-18 NOTE — Progress Notes (Signed)
Pt. Had hypoglycemic episode blood sugar was 42,  Denies any symptoms except for weakness and sleepy. Given glucose gel and snacks with cup of orange juice. Rechecked after and sugar was 69. Given more snacks, K. SchorrNP was notified with order just to monitor blood sugar at least every 2 hours x2-3. Will continue to monitor blood sugar.

## 2013-10-18 NOTE — Progress Notes (Signed)
[facility] PROGRESS NOTE  [name] ZOX:[id number] DOB: 03-12-1966 DOA: 10/17/2013 PCP: No PCP Per Patient  Assessment/Plan:  Principal Problem:   Chest pain - Patient denies any current chest pain - Myoview scheduled for 10/19/2013 - Cardiology on board - Troponins x3 negative  Active Problems:   CAD (coronary artery disease) - Continue statin aspirin and Plavix.    HTN (hypertension) - Continue carvedilol    Hyperlipidemia -Statin on board    TIA (transient ischemic attack) - Continue Plavix and statin    DM (diabetes mellitus) - Patient has brittle diabetes and has ranged from 600 to less than 10. While n.p.o. required D5 administration given hypoglycemia. While note tests awaiting will plan on placing on diabetic diet.  Code Status: full Family Communication: None at bedside Disposition Plan: Pending Myoview test results and once blood sugars relatively well controlled   Consultants:  Cardiology  Procedures:  Myoview pending  Antibiotics:  None  HPI/Subjective: No acute issues reported overnight. No new complaints  Objective: Filed Vitals:   10/18/13 1700  BP: 107/49  Pulse: 83  Temp:   Resp:     Intake/Output Summary (Last 24 hours) at 10/18/13 1851 Last data filed at 10/18/13 1726  Gross per 24 hour  Intake    600 ml  Output      0 ml  Net    600 ml   Filed Weights   10/17/13 0125 10/17/13 0900  Weight: 76.658 kg (169 lb) 75.5 kg (166 lb 7.2 oz)    Exam:   General:  Pt in NAD, Alert and awake  Cardiovascular: normal s 1 and s2 no mrg  Respiratory: Clear to auscultation, no increased work of breathing, breath sounds bilaterally  Abdomen: Soft, nondistended, nontender  Musculoskeletal: No cyanosis or clubbing  Data Reviewed: Basic Metabolic Panel:  Recent Labs Lab 10/17/13 0156 10/17/13 1300 10/18/13 0053  NA 136*  --  132*  K 4.8  --  4.7  CL 97  --  96  CO2 23  --  22  GLUCOSE 253* 646* 220*  BUN 36*   --  32*  CREATININE 2.18*  --  1.85*  CALCIUM 9.0  --  7.9*   Liver Function Tests:  Recent Labs Lab 10/18/13 0053  AST 18  ALT 11  ALKPHOS 98  BILITOT <0.2*  PROT 5.5*  ALBUMIN 2.3*   No results found for this basename: LIPASE, AMYLASE,  in the last 168 hours No results found for this basename: AMMONIA,  in the last 168 hours CBC:  Recent Labs Lab 10/17/13 0156 10/18/13 0053  WBC 9.6 6.7  HGB 11.2* 9.8*  HCT 33.1* 29.1*  MCV 87.1 87.7  PLT 336 260   Cardiac Enzymes:  Recent Labs Lab 10/17/13 1300 10/17/13 1920 10/18/13 0053  TROPONINI <0.30 <0.30 <0.30   BNP (last 3 results) No results found for this basename: PROBNP,  in the last 8760 hours CBG:  Recent Labs Lab 10/18/13 1028 10/18/13 1055 10/18/13 1117 10/18/13 1310 10/18/13 1559  GLUCAP 104* 34* 106* 99 354*    No results found for this or any previous visit (from the past 240 hour(s)).   Studies: Nm Pulmonary Perf And Vent  10/17/2013   CLINICAL DATA:  Chest pain and shortness of breath.  EXAM: NUCLEAR MEDICINE VENTILATION - PERFUSION LUNG SCAN  TECHNIQUE: Ventilation images were obtained in multiple projections using inhaled aerosol technetium 99 M DTPA. Perfusion images were obtained in multiple projections after intravenous injection of Tc-38m  MAA.  COMPARISON:  Chest x-ray dated 10/17/2013  RADIOPHARMACEUTICALS:  40 mCi Tc-3732m DTPA aerosol and 6 mCi Tc-8032m MAA  FINDINGS: Ventilation: No focal ventilation defect.  Perfusion: No wedge shaped peripheral perfusion defects to suggest acute pulmonary embolism.  IMPRESSION: Normal ventilation perfusion lung scan. No evidence of pulmonary embolism.   Electronically Signed   By: Geanie CooleyJim  Maxwell M.D.   On: 10/17/2013 15:37   Dg Chest Port 1 View  10/17/2013   CLINICAL DATA:  Chest pain  EXAM: PORTABLE CHEST - 1 VIEW  COMPARISON:  None available  FINDINGS: Median sternotomy wires with underlying CABG markers and surgical clips are noted. Transverse heart size is  within normal limits. Mediastinal silhouette is unremarkable.  Lungs are normally inflated. No focal infiltrate, pulmonary edema, or pleural effusion identified. No pneumothorax.  No acute osseous abnormality identified.  IMPRESSION: No acute cardiopulmonary abnormality.   Electronically Signed   By: Rise MuBenjamin  McClintock M.D.   On: 10/17/2013 01:51    Scheduled Meds: . ALPRAZolam  0.5 mg Oral BID  . aspirin EC  325 mg Oral Daily  . atorvastatin  40 mg Oral q1800  . carvedilol  6.25 mg Oral BID WC  . clopidogrel  75 mg Oral Q breakfast  . enoxaparin (LOVENOX) injection  40 mg Subcutaneous Q24H  . ezetimibe  10 mg Oral Daily  . FLUoxetine  20 mg Oral Daily  . gabapentin  300 mg Oral TID  . insulin aspart  0-20 Units Subcutaneous TID WC  . insulin aspart  10 Units Subcutaneous TID WC  . insulin detemir  30 Units Subcutaneous QHS  . insulin detemir  30 Units Subcutaneous QHS  . lamoTRIgine  25 mg Oral BID  . memantine  10 mg Oral BID  . nitroGLYCERIN  0.5 inch Topical Q6H  . sodium chloride  3 mL Intravenous Q12H  . topiramate  50 mg Oral BID   Continuous Infusions:    Time spent: > 35 minutes    [name], [name]  [facility] Pager [phone]. If 7PM-7AM, please contact night-coverage at [email or web address], password [email or web address] 10/18/2013, 6:51 PM  LOS: 1 day

## 2013-10-18 NOTE — Progress Notes (Signed)
Results for [name], [name] (MRN [id number]) as of 10/18/2013 11:58  Ref. Range 10/18/2013 06:11 10/18/2013 10:22 10/18/2013 10:28 10/18/2013 10:55 10/18/2013 11:17  Glucose-Capillary Latest Range: 70-99 mg/dL 409308 (H) <81<10 (LL) 191104 (H) 34 (LL) 106 (H)   Recommend discontinuing Novolog 10 units meal coverage with meals since CBGs are less than 70 mg/dl and patient eating less than 50% of meals. Will continue to follow while in hospital.   Smith MinceKendra Britteny Fiebelkorn RN BSN CDE

## 2013-10-18 NOTE — Progress Notes (Signed)
Patient ID: Christella ScheuermannLloyd Kevin Abarca, male   DOB: 1966/05/25, 48 y.o.   MRN: [id number]    Subjective:  Denies SSCP, palpitations or Dyspnea   Objective:  Filed Vitals:   10/17/13 0900 10/17/13 1212 10/17/13 1438 10/17/13 2021  BP: 128/71 170/88 137/81 129/67  Pulse: 87 91 91 82  Temp:   97.8 F (36.6 C) 97.6 F (36.4 C)  TempSrc:   Oral Oral  Resp: 20  18 18   Height:      Weight: 166 lb 7.2 oz (75.5 kg)     SpO2: 96%  91% 97%    Intake/Output from previous day:  Intake/Output Summary (Last 24 hours) at 10/18/13 [id number] Last data filed at 10/17/13 2300  Gross per 24 hour  Intake    480 ml  Output    500 ml  Net    -20 ml    Physical Exam: Affect appropriate Healthy:  appears stated age HEENT: normal Neck supple with no adenopathy JVP normal no bruits no thyromegaly Lungs clear with no wheezing and good diaphragmatic motion Heart:  S1/S2 no murmur, no rub, gallop or click PMI normal Abdomen: benighn, BS positve, no tenderness, no AAA no bruit.  No HSM or HJR Distal pulses intact with no bruits No edema Neuro non-focal Skin warm and dry No muscular weakness   Lab Results: Basic Metabolic Panel:  Recent Labs  13/24/4000/11/26 0156 10/17/13 1300 10/18/13 0053  NA 136*  --  132*  K 4.8  --  4.7  CL 97  --  96  CO2 23  --  22  GLUCOSE 253* 646* 220*  BUN 36*  --  32*  CREATININE 2.18*  --  1.85*  CALCIUM 9.0  --  7.9*   Liver Function Tests:  Recent Labs  10/18/13 0053  AST 18  ALT 11  ALKPHOS 98  BILITOT <0.2*  PROT 5.5*  ALBUMIN 2.3*   No results found for this basename: LIPASE, AMYLASE,  in the last 72 hours CBC:  Recent Labs  10/17/13 0156 10/18/13 0053  WBC 9.6 6.7  HGB 11.2* 9.8*  HCT 33.1* 29.1*  MCV 87.1 87.7  PLT 336 260   Cardiac Enzymes:  Recent Labs  10/17/13 1300 10/17/13 1920 10/18/13 0053  TROPONINI <0.30 <0.30 <0.30   D-Dimer:  Recent Labs  10/17/13 0300  DDIMER 0.51*   Hemoglobin A1C:  Recent Labs  10/17/13 1140    HGBA1C 9.3*    Imaging: Imaging results have been reviewed and Nm Pulmonary Perf And Vent  10/17/2013   CLINICAL DATA:  Chest pain and shortness of breath.  EXAM: NUCLEAR MEDICINE VENTILATION - PERFUSION LUNG SCAN  TECHNIQUE: Ventilation images were obtained in multiple projections using inhaled aerosol technetium 99 M DTPA. Perfusion images were obtained in multiple projections after intravenous injection of Tc-6828m MAA.  COMPARISON:  Chest x-ray dated 10/17/2013  RADIOPHARMACEUTICALS:  40 mCi Tc-6828m DTPA aerosol and 6 mCi Tc-4128m MAA  FINDINGS: Ventilation: No focal ventilation defect.  Perfusion: No wedge shaped peripheral perfusion defects to suggest acute pulmonary embolism.  IMPRESSION: Normal ventilation perfusion lung scan. No evidence of pulmonary embolism.   Electronically Signed   By: Geanie CooleyJim  Maxwell M.D.   On: 10/17/2013 15:37   Dg Chest Port 1 View  10/17/2013   CLINICAL DATA:  Chest pain  EXAM: PORTABLE CHEST - 1 VIEW  COMPARISON:  None available  FINDINGS: Median sternotomy wires with underlying CABG markers and surgical clips are noted. Transverse heart size is within normal  limits. Mediastinal silhouette is unremarkable.  Lungs are normally inflated. No focal infiltrate, pulmonary edema, or pleural effusion identified. No pneumothorax.  No acute osseous abnormality identified.  IMPRESSION: No acute cardiopulmonary abnormality.   Electronically Signed   By: Rise Mu M.D.   On: 10/17/2013 01:51    Cardiac Studies:  ECG:   ST inferolateral J point elevation   Telemetry:  SR/ST no arrhythmia   Echo:  Valves ok EF 50-55%   Medications:   . ALPRAZolam  0.5 mg Oral BID  . aspirin EC  325 mg Oral Daily  . atorvastatin  40 mg Oral q1800  . carvedilol  6.25 mg Oral BID WC  . clopidogrel  75 mg Oral Q breakfast  . enoxaparin (LOVENOX) injection  1 mg/kg Subcutaneous Q12H  . ezetimibe  10 mg Oral Daily  . FLUoxetine  20 mg Oral Daily  . gabapentin  300 mg Oral TID  . insulin  aspart  0-20 Units Subcutaneous TID WC  . insulin aspart  10 Units Subcutaneous TID WC  . insulin detemir  30 Units Subcutaneous QHS  . insulin detemir  30 Units Subcutaneous QHS  . lamoTRIgine  25 mg Oral BID  . memantine  10 mg Oral BID  . nitroGLYCERIN  0.5 inch Topical Q6H  . sodium chloride  3 mL Intravenous Q12H  . topiramate  50 mg Oral BID       Assessment/Plan:  Chest Pain:  Atypical no acute ECG changes R/O  Myovue today D/C if non ischemic Outpatient f/u with cardiologist In [place]  Chol:  Continue dual Rx with statin and zetia  DM:  Discussed low carb diet.  Target hemoglobin A1c is 6.5 or less.  Continue current medications.   Charlton Haws 10/18/2013, 9:37 AM

## 2013-10-19 LAB — BASIC METABOLIC PANEL
BUN: 34 mg/dL — ABNORMAL HIGH (ref 6–23)
BUN: 40 mg/dL — ABNORMAL HIGH (ref 6–23)
BUN: 39 mg/dL — ABNORMAL HIGH (ref 6–23)
BUN: 38 mg/dL — ABNORMAL HIGH (ref 6–23)
CHLORIDE: 91 meq/L — AB (ref 96–112)
CO2: 14 mEq/L — ABNORMAL LOW (ref 19–32)
CO2: 11 meq/L — AB (ref 19–32)
CO2: 12 mEq/L — ABNORMAL LOW (ref 19–32)
CO2: 18 mEq/L — ABNORMAL LOW (ref 19–32)
CREATININE: 2.43 mg/dL — AB (ref 0.50–1.35)
Calcium: 8.2 mg/dL — ABNORMAL LOW (ref 8.4–10.5)
Calcium: 8.3 mg/dL — ABNORMAL LOW (ref 8.4–10.5)
Calcium: 9 mg/dL (ref 8.4–10.5)
Calcium: 8.5 mg/dL (ref 8.4–10.5)
Chloride: 92 mEq/L — ABNORMAL LOW (ref 96–112)
Chloride: 91 mEq/L — ABNORMAL LOW (ref 96–112)
Chloride: 94 mEq/L — ABNORMAL LOW (ref 96–112)
Creatinine, Ser: 2.1 mg/dL — ABNORMAL HIGH (ref 0.50–1.35)
Creatinine, Ser: 2.29 mg/dL — ABNORMAL HIGH (ref 0.50–1.35)
Creatinine, Ser: 2.34 mg/dL — ABNORMAL HIGH (ref 0.50–1.35)
GFR calc Af Amer: 42 mL/min — ABNORMAL LOW (ref >90)
GFR calc Af Amer: 37 mL/min — ABNORMAL LOW (ref >90)
GFR calc Af Amer: 35 mL/min — ABNORMAL LOW (ref >90)
GFR calc non Af Amer: 36 mL/min — ABNORMAL LOW (ref >90)
GFR calc non Af Amer: 33 mL/min — ABNORMAL LOW (ref >90)
GFR calc non Af Amer: 32 mL/min — ABNORMAL LOW (ref >90)
GFR, EST AFRICAN AMERICAN: 38 mL/min — AB (ref >90)
GFR, EST NON AFRICAN AMERICAN: 30 mL/min — AB (ref >90)
GLUCOSE: 632 mg/dL — AB (ref 70–99)
Glucose, Bld: 723 mg/dL (ref 70–99)
Glucose, Bld: 803 mg/dL (ref 70–99)
Glucose, Bld: 499 mg/dL — ABNORMAL HIGH (ref 70–99)
POTASSIUM: 4.9 meq/L (ref 3.7–5.3)
Potassium: 6.2 mEq/L — ABNORMAL HIGH (ref 3.7–5.3)
Potassium: 5.6 mEq/L — ABNORMAL HIGH (ref 3.7–5.3)
Potassium: 4.6 mEq/L (ref 3.7–5.3)
SODIUM: 132 meq/L — AB (ref 137–147)
SODIUM: 134 meq/L — AB (ref 137–147)
Sodium: 132 mEq/L — ABNORMAL LOW (ref 137–147)
Sodium: 133 mEq/L — ABNORMAL LOW (ref 137–147)

## 2013-10-19 LAB — GLUCOSE, CAPILLARY
GLUCOSE-CAPILLARY: 542 mg/dL — AB (ref 70–99)
GLUCOSE-CAPILLARY: 552 mg/dL — AB (ref 70–99)
GLUCOSE-CAPILLARY: 507 mg/dL — AB (ref 70–99)
Glucose-Capillary: >600 mg/dL (ref 70–99)
Glucose-Capillary: 451 mg/dL — ABNORMAL HIGH (ref 70–99)

## 2013-10-19 LAB — GLUCOSE, RANDOM: GLUCOSE: 653 mg/dL — AB (ref 70–99)

## 2013-10-19 MED ORDER — DEXTROSE-NACL 5-0.45 % IV SOLN: INTRAVENOUS | Status: DC

## 2013-10-19 MED ORDER — SODIUM CHLORIDE 0.9 % IV SOLN
INTRAVENOUS | Status: DC
  Administered 2013-10-19: 125 mL/h via INTRAVENOUS

## 2013-10-19 MED ORDER — INSULIN REGULAR HUMAN 100 UNIT/ML IJ SOLN
INTRAMUSCULAR | Status: DC
  Administered 2013-10-19: 7.8 [IU]/h via INTRAVENOUS
  Administered 2013-10-19: 9.8 [IU]/h via INTRAVENOUS
  Administered 2013-10-19: 6.6 [IU]/h via INTRAVENOUS
  Filled 2013-10-19: qty 1

## 2013-10-19 MED ORDER — INSULIN REGULAR BOLUS VIA INFUSION
0.0000 [IU] | Freq: Three times a day (TID) | INTRAVENOUS | Status: DC
  Administered 2013-10-19: 5.4 [IU] via INTRAVENOUS
  Filled 2013-10-19: qty 10

## 2013-10-19 MED ORDER — DEXTROSE 50 % IV SOLN: 25.0000 mL | INTRAVENOUS | Status: DC | PRN

## 2013-10-19 NOTE — Progress Notes (Signed)
Patient Name: [name] Date of Encounter: 10/19/2013     Principal Problem:   Chest pain Active Problems:   CAD (coronary artery disease)   HTN (hypertension)   Hyperlipidemia   Vascular dementia   TIA (transient ischemic attack)   DM (diabetes mellitus)    SUBJECTIVE Nauseated  Myovue cancelled due to BS over 700 No chest pain    CURRENT MEDS . ALPRAZolam  0.5 mg Oral BID  . aspirin EC  325 mg Oral Daily  . atorvastatin  40 mg Oral q1800  . carvedilol  6.25 mg Oral BID WC  . clopidogrel  75 mg Oral Q breakfast  . enoxaparin (LOVENOX) injection  40 mg Subcutaneous Q24H  . ezetimibe  10 mg Oral Daily  . FLUoxetine  20 mg Oral Daily  . gabapentin  300 mg Oral TID  . insulin regular  0-10 Units Intravenous TID WC  . lamoTRIgine  25 mg Oral BID  . memantine  10 mg Oral BID  . nitroGLYCERIN  0.5 inch Topical Q6H  . sodium chloride  3 mL Intravenous Q12H  . topiramate  50 mg Oral BID    OBJECTIVE  Filed Vitals:   10/18/13 1500 10/18/13 1700 10/18/13 2002 10/19/13 0408  BP: 151/88 107/49 125/61 127/64  Pulse: 84 83 80 77  Temp:   98.9 F (37.2 C) 98.2 F (36.8 C)  TempSrc:   Oral Oral  Resp:   18 18  Height:      Weight:      SpO2:   99% 97%    Intake/Output Summary (Last 24 hours) at 10/19/13 0941 Last data filed at 10/19/13 0700  Gross per 24 hour  Intake    360 ml  Output      0 ml  Net    360 ml   Filed Weights   10/17/13 0125 10/17/13 0900  Weight: 169 lb (76.658 kg) 166 lb 7.2 oz (75.5 kg)    PHYSICAL EXAM  General: Pleasant, NAD. Neuro: Alert and oriented X 3. Moves all extremities spontaneously. Psych: Normal affect. HEENT:  Normal  Neck: Supple without bruits or JVD. Lungs:  Resp regular and unlabored, CTA. Heart: RRR no s3, s4, or murmurs. Abdomen: Soft, non-tender, non-distended, BS + x 4.  Extremities: No clubbing, cyanosis or edema. DP/PT/Radials 2+ and equal bilaterally.  Accessory Clinical Findings  CBC  Recent  Labs  10/17/13 0156 10/18/13 0053  WBC 9.6 6.7  HGB 11.2* 9.8*  HCT 33.1* 29.1*  MCV 87.1 87.7  PLT 336 260   Basic Metabolic Panel  Recent Labs  10/18/13 0053 10/19/13 0412 10/19/13 0735  NA 132*  --  132*  K 4.7  --  6.2*  CL 96  --  92*  CO2 22  --  14*  GLUCOSE 220* 653* 723*  BUN 32*  --  34*  CREATININE 1.85*  --  2.10*  CALCIUM 7.9*  --  8.2*   Liver Function Tests  Recent Labs  10/18/13 0053  AST 18  ALT 11  ALKPHOS 98  BILITOT <0.2*  PROT 5.5*  ALBUMIN 2.3*   Cardiac Enzymes  Recent Labs  10/17/13 1300 10/17/13 1920 10/18/13 0053  TROPONINI <0.30 <0.30 <0.30   D-Dimer  Recent Labs  10/17/13 0300  DDIMER 0.51*   Hemoglobin A1C  Recent Labs  10/17/13 1140  HGBA1C 9.3*   TELE  NSR no bradycardia   ECG  SR rate 104  No acute ST/T wave changes  Radiology/Studies  Nm Pulmonary Perf And Vent  10/17/2013   CLINICAL DATA:  Chest pain and shortness of breath.  EXAM: NUCLEAR MEDICINE VENTILATION - PERFUSION LUNG SCAN  IMPRESSION: Normal ventilation perfusion lung scan. No evidence of pulmonary embolism.   Electronically Signed   By: Geanie Cooley M.D.   On: 10/17/2013 15:37   Dg Chest Port 1 View  10/17/2013   CLINICAL DATA:  Chest pain  EXAM: PORTABLE CHEST - 1 VIEW   IMPRESSION: No acute cardiopulmonary abnormality.   Electronically Signed   By: Rise Mu M.D.   On: 10/17/2013 01:51    ASSESSMENT AND PLAN Chest Pain:  Resolved R/O no ECG changes myovue postponed due to elevated BS.  Needs ABG BMET repeated for K and urine ketone level  Consider glucomander.  Will check ECG to make sure nausea not related to ischemia Primary issue at this point is poorly controlled DM A1c  9.3 and no better here in hospital.  Can consider outpatient myovue once other issues are addressed   Signed, Nicolasa Ducking NP

## 2013-10-19 NOTE — Discharge Summary (Signed)
Physician Discharge Summary  [name] UJW:[id number] DOB: May 22, 1966 DOA: 10/17/2013  PCP: No PCP Per Patient  Admit date: 10/17/2013 Discharge date: 10/19/2013  Time spent: > 35 minutes  Patient left AGAINST MEDICAL ADVICE  Discharge Diagnoses:  Principal Problem:   Chest pain Active Problems:   CAD (coronary artery disease)   HTN (hypertension)   Hyperlipidemia   Vascular dementia   TIA (transient ischemic attack)   DM (diabetes mellitus)   Discharge Condition: Elevated blood sugars  Diet recommendation: Diabetic diet  Filed Weights   10/17/13 0125 10/17/13 0900  Weight: 76.658 kg (169 lb) 75.5 kg (166 lb 7.2 oz)    History of present illness:  Patient is a 48 year old Caucasian male with history of diabetes mellitus, hypertension, hyperlipidemia, multiple TIAs, CAD post CABG, 4 vessel bypass in 2005 who presented to the ED complaining of chest pain. Also has a history of brittle diabetes  Hospital Course:  Chest pain - Troponins negative - Unable to obtain stress test secondary to elevated blood sugar levels (patient on insulin drip) - Despite recommendations for further evaluation which patient and sister were aware of they opted to sign out AGAINST MEDICAL ADVICE.  Hyperglycemia - Differential includes either HONK or DKA. - Started on insulin drip after patient was noted to have persistently elevated hyperglycemia and elevated anion gap of 30. - Was on insulin drip for several hours with improvement in acidosis with a drop in anion gap to 24. - Despite my recommendations for further treatment sister and patient opted to sign out AGAINST MEDICAL ADVICE.  Procedures:  Insulin gtt  Consultations:  Cardiology  Discharge Exam: Filed Vitals:   10/19/13 0408  BP: 127/64  Pulse: 77  Temp: 98.2 F (36.8 C)  Resp: 18    General: Pt Alert and awake Cardiovascular: RRR, no MRG Respiratory: CTA BL, no wheezes  Discharge Instructions     Medication  List    ASK your doctor about these medications       ALPRAZolam 1 MG tablet  Commonly known as:  XANAX  Take 1 mg by mouth 2 (two) times daily.     amLODipine 10 MG tablet  Commonly known as:  NORVASC  Take 10 mg by mouth daily.     atorvastatin 20 MG tablet  Commonly known as:  LIPITOR  Take 20 mg by mouth daily.     carvedilol 12.5 MG tablet  Commonly known as:  COREG  Take 12.5 mg by mouth 2 (two) times daily with a meal.     clopidogrel 75 MG tablet  Commonly known as:  PLAVIX  Take 75 mg by mouth daily with breakfast.     gabapentin 800 MG tablet  Commonly known as:  NEURONTIN  Take 800 mg by mouth 3 (three) times daily.     insulin detemir 100 UNIT/ML injection  Commonly known as:  LEVEMIR  Inject 25 Units into the skin 2 (two) times daily.     omeprazole 20 MG capsule  Commonly known as:  PRILOSEC  Take 20 mg by mouth every morning.     Vitamin D (Ergocalciferol) 50000 UNITS Caps capsule  Commonly known as:  DRISDOL  Take 50,000 Units by mouth every 7 (seven) days.       Allergies  Allergen Reactions  . Dilantin [Phenytoin Sodium Extended] Itching  . Levaquin [Levofloxacin In D5w] Itching  . Seroquel [Quetiapine Fumarate] Itching      The results of significant diagnostics from this hospitalization (including imaging,  microbiology, ancillary and laboratory) are listed below for reference.    Significant Diagnostic Studies: Nm Pulmonary Perf And Vent  10/17/2013   CLINICAL DATA:  Chest pain and shortness of breath.  EXAM: NUCLEAR MEDICINE VENTILATION - PERFUSION LUNG SCAN  TECHNIQUE: Ventilation images were obtained in multiple projections using inhaled aerosol technetium 99 M DTPA. Perfusion images were obtained in multiple projections after intravenous injection of Tc-1344m MAA.  COMPARISON:  Chest x-ray dated 10/17/2013  RADIOPHARMACEUTICALS:  40 mCi Tc-5344m DTPA aerosol and 6 mCi Tc-6244m MAA  FINDINGS: Ventilation: No focal ventilation defect.  Perfusion:  No wedge shaped peripheral perfusion defects to suggest acute pulmonary embolism.  IMPRESSION: Normal ventilation perfusion lung scan. No evidence of pulmonary embolism.   Electronically Signed   By: Geanie CooleyJim  Maxwell M.D.   On: 10/17/2013 15:37   Dg Chest Port 1 View  10/17/2013   CLINICAL DATA:  Chest pain  EXAM: PORTABLE CHEST - 1 VIEW  COMPARISON:  None available  FINDINGS: Median sternotomy wires with underlying CABG markers and surgical clips are noted. Transverse heart size is within normal limits. Mediastinal silhouette is unremarkable.  Lungs are normally inflated. No focal infiltrate, pulmonary edema, or pleural effusion identified. No pneumothorax.  No acute osseous abnormality identified.  IMPRESSION: No acute cardiopulmonary abnormality.   Electronically Signed   By: Rise MuBenjamin  McClintock M.D.   On: 10/17/2013 01:51    Microbiology: No results found for this or any previous visit (from the past 240 hour(s)).   Labs: Basic Metabolic Panel:  Recent Labs Lab 10/18/13 0053 10/19/13 0412 10/19/13 0735 10/19/13 1020 10/19/13 1200 10/19/13 1405  NA 132*  --  132* 132* 133* 134*  K 4.7  --  6.2* 5.6* 4.9 4.6  CL 96  --  92* 91* 91* 94*  CO2 22  --  14* 11* 12* 18*  GLUCOSE 220* 653* 723* 803* 632* 499*  BUN 32*  --  34* 40* 39* 38*  CREATININE 1.85*  --  2.10* 2.29* 2.34* 2.43*  CALCIUM 7.9*  --  8.2* 8.3* 9.0 8.5   Liver Function Tests:  Recent Labs Lab 10/18/13 0053  AST 18  ALT 11  ALKPHOS 98  BILITOT <0.2*  PROT 5.5*  ALBUMIN 2.3*   No results found for this basename: LIPASE, AMYLASE,  in the last 168 hours No results found for this basename: AMMONIA,  in the last 168 hours CBC:  Recent Labs Lab 10/17/13 0156 10/18/13 0053  WBC 9.6 6.7  HGB 11.2* 9.8*  HCT 33.1* 29.1*  MCV 87.1 87.7  PLT 336 260   Cardiac Enzymes:  Recent Labs Lab 10/17/13 1300 10/17/13 1920 10/18/13 0053  TROPONINI <0.30 <0.30 <0.30   BNP: BNP (last 3 results) No results found for  this basename: PROBNP,  in the last 8760 hours CBG:  Recent Labs Lab 10/19/13 0312 10/19/13 1106 10/19/13 1209 10/19/13 1343 10/19/13 1425  GLUCAP 542* >600* 552* 451* 507*       Signed:  [name], Lillian Ballester  [facility] 10/19/2013, 3:35 PM

## 2013-10-19 NOTE — Progress Notes (Signed)
Inpatient Diabetes Program Recommendations  AACE/ADA: New Consensus Statement on Inpatient Glycemic Control (2013)  Target Ranges:  Prepandial:   less than 140 mg/dL      Peak postprandial:   less than 180 mg/dL (1-2 hours)      Critically ill patients:  140 - 180 mg/dL   Roller coaster glucose pattern Outpatient Diabetes medications: Levemir 25 units bid (no other insulin documented for home therapy) Current orders for Inpatient glycemic control: IV insulin drip per GS Prior glucose pattern appears to show too much novolog meal coverage causing hypo, then treatment causing hyper, then high doses of correction again causing hypo. Once basal insulin was stopped, the pt's glucose was extremely high and IV insulin ordered.   Once pt's glucose is stable, please give Levemir 20 units 2 hrs prior to discontinuation of drip.  Would resume subcutaneous insulin 20 units levemir bid and correction insulin tid and HS. If post-prandial glucose pattern is high, then could resume 3-5 units meal coverage if pt eats at least 50%.  Thank you, Lenor CoffinAnn Suhayb Anzalone, RN, CNS, Diabetes Coordinator [phone])

## 2013-10-19 NOTE — Progress Notes (Signed)
CRITICAL VALUE ALERT  Critical value received:  Cbg= 723  Date of notification:  10/19/2013  Time of notification:  0840  Critical value read back:yes  Nurse who received alert:  Thomas HoffBurton, Braeton Wolgamott McClintock  MD notified (1st page):  Dr Cena BentonVega  Time of first page:  8:53 AM  MD notified (2nd page):  Time of second page:  Responding MD:  Dr. Cena BentonVega  Time MD responded:  [phone]

## 2015-08-08 IMAGING — CR DG CHEST 1V PORT
1 series · 1 of 1 positions shown · non-contrast
Comparison: None available

CLINICAL DATA: Chest pain

EXAM:
PORTABLE CHEST - 1 VIEW

[AP]
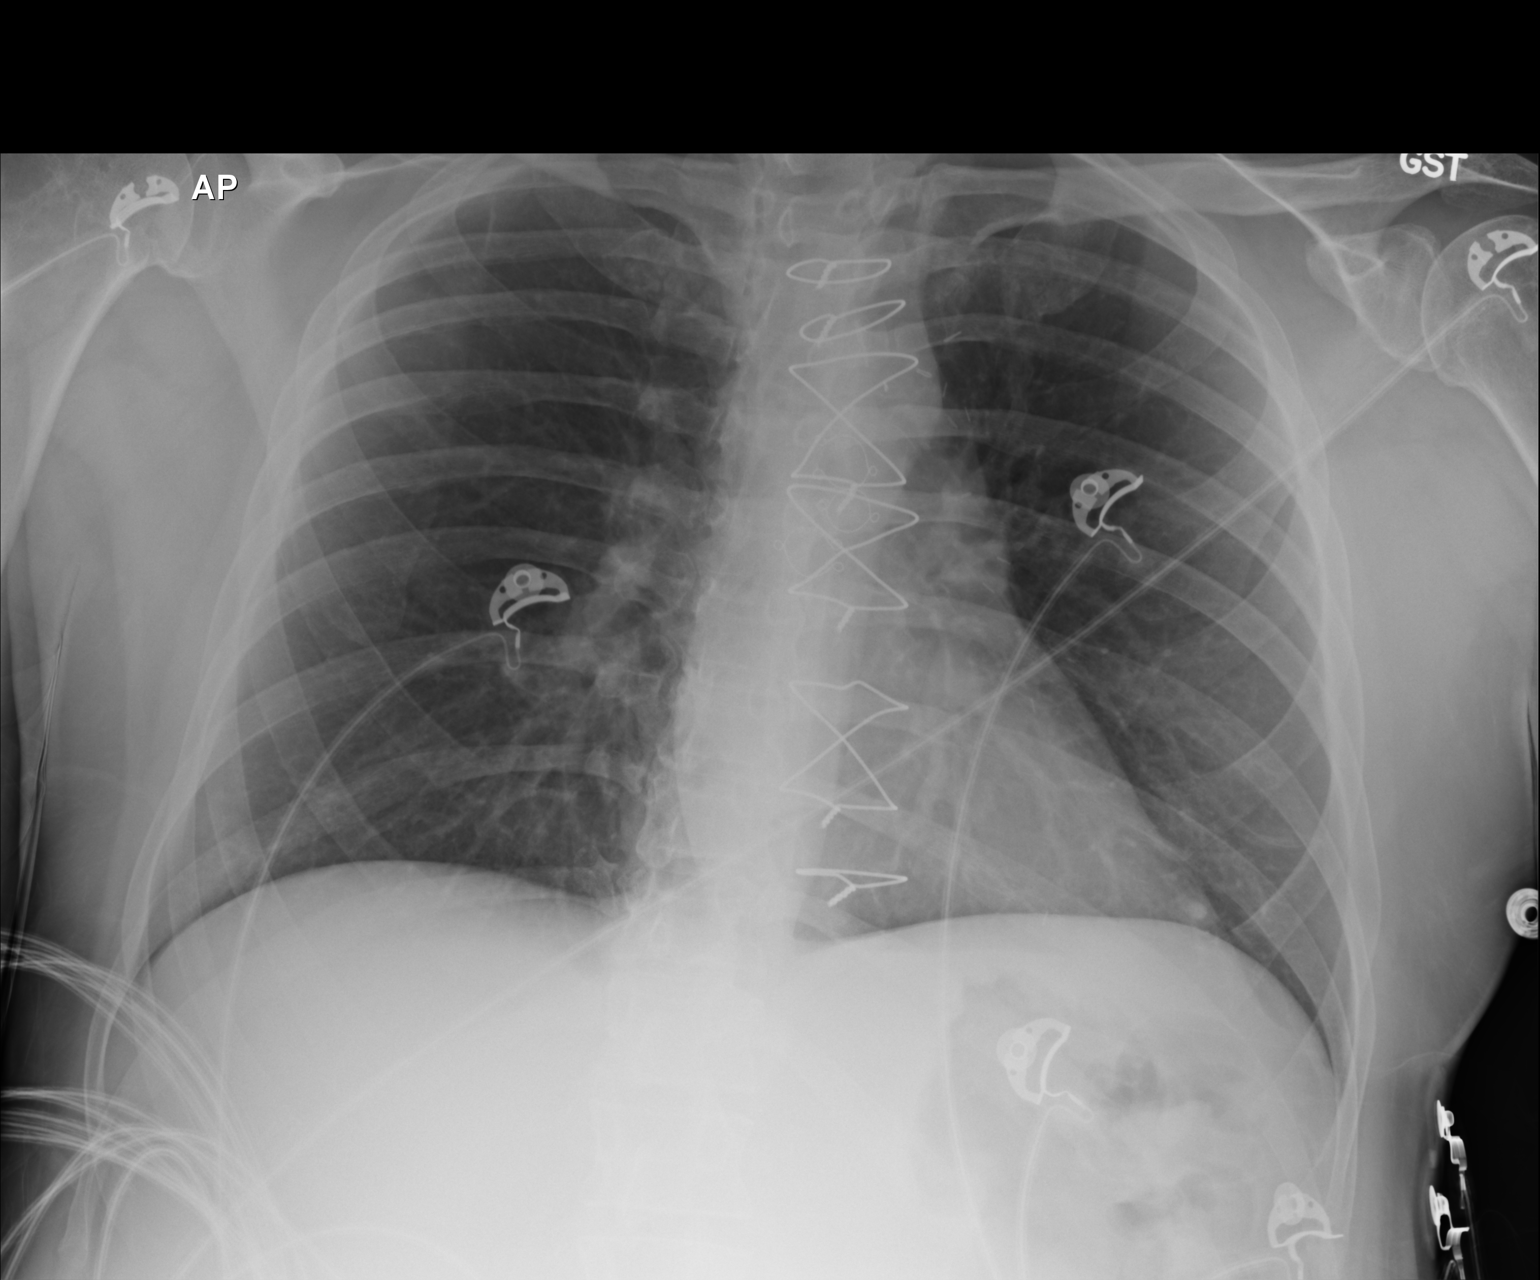

[1 of 1 positions shown; findings below may reference images not displayed]

FINDINGS: Median sternotomy wires with underlying CABG markers and surgical
clips are noted. Transverse heart size is within normal limits.
Mediastinal silhouette is unremarkable.

Lungs are normally inflated. No focal infiltrate, pulmonary edema,
or pleural effusion identified. No pneumothorax.

No acute osseous abnormality identified.
IMPRESSION: No acute cardiopulmonary abnormality.

## 2016-09-14 DEATH — deceased
